# Patient Record
Sex: Female | Born: 1950 | Race: White | Marital: Married | Smoking: Never smoker
Health system: Northeastern US, Academic
[De-identification: ages and names within clinical notes are randomized; demographics above are authoritative.]

## PROBLEM LIST (undated history)

## (undated) DIAGNOSIS — M2550 Pain in unspecified joint: Secondary | ICD-10-CM

## (undated) DIAGNOSIS — J45909 Unspecified asthma, uncomplicated: Secondary | ICD-10-CM

## (undated) DIAGNOSIS — M1990 Unspecified osteoarthritis, unspecified site: Secondary | ICD-10-CM

## (undated) HISTORY — DX: Pain in unspecified joint: M25.50

## (undated) HISTORY — DX: Unspecified osteoarthritis, unspecified site: M19.90

---

## 2015-09-05 ENCOUNTER — Other Ambulatory Visit: Payer: Self-pay | Admitting: Pediatrics

## 2015-09-05 LAB — COMPREHENSIVE METABOLIC PANEL
ALT: 16 U/L (ref 0–35)
AST: 19 U/L (ref 0–35)
Albumin: 4.4 mg/dL (ref 3.5–5.2)
Alk Phos: 62 U/L (ref 35–105)
Anion Gap: 10 (ref 7–16)
Bilirubin,Total: 0.4 mg/dL (ref 0.0–1.2)
CO2: 28 mmol/L (ref 20–28)
Calcium: 9.1 mg/dL (ref 8.6–10.2)
Chloride: 102 mmol/L (ref 96–108)
Creatinine: 0.76 mg/dL (ref 0.51–0.95)
GFR,Black: 93 mL/min/{1.73_m2}
GFR,Caucasian: 77 mL/min/{1.73_m2}
Glucose: 89 mg/dL (ref 60–99)
Lab: 13 mg/dL (ref 6–20)
Potassium: 4.6 mmol/L (ref 3.4–4.7)
Sodium: 140 mmol/L (ref 133–145)
Total Protein: 6.9 g/dL (ref 6.3–7.7)

## 2015-09-05 LAB — CBC AND DIFFERENTIAL
Baso # K/uL: 0.1 10*3/uL (ref 0.0–0.1)
Basophil %: 1.2 % (ref 0.1–1.2)
Eos # K/uL: 0.4 10*3/uL (ref 0.0–0.4)
Eosinophil %: 7.4 % — ABNORMAL HIGH (ref 0.7–5.8)
Hematocrit: 40.2 % (ref 34.1–44.9)
Hemoglobin: 13.9 g/dL (ref 11.2–15.7)
Immature Granulocytes Absolute: 0.01 10*3/uL (ref 0.0–0.2)
Immature Granulocytes: 0.2 % (ref 0.0–2.0)
Lymph # K/uL: 1.7 10*3/uL (ref 1.2–3.7)
Lymphocyte %: 34.1 % (ref 19.3–51.7)
MCH: 30.8 pg (ref 25.6–32.2)
MCHC: 34.6 g/dL (ref 32.2–35.5)
MCV: 88.9 fL (ref 79.4–94.8)
Mono # K/uL: 0.3 10*3/uL (ref 0.2–0.9)
Monocyte %: 6.4 % (ref 4.7–12.5)
Neut # K/uL: 2.5 10*3/uL (ref 1.6–6.0)
Nucl RBC # K/uL: 0 /100 WBC (ref 0.0–0.2)
Platelets: 295 10*3/uL (ref 182–369)
RBC Distribution Width-SD: 40.8 fL (ref 36.4–46.3)
RBC: 4.52 10*6/uL (ref 3.93–5.22)
RDW: 12.6 % (ref 11.7–14.4)
Seg Neut %: 50.7 % (ref 34.0–71.1)
WBC: 5 10*3/uL (ref 4.0–10.0)

## 2015-09-05 LAB — TSH: TSH: 1.41 u[IU]/mL (ref 0.27–4.20)

## 2015-09-05 LAB — LIPID PANEL
Chol/HDL Ratio: 2.2 (ref 1.0–3.5)
Cholesterol: 211 mg/dL — ABNORMAL HIGH (ref 0–199)
HDL: 95 mg/dL — ABNORMAL HIGH (ref 40–60)
LDL Calculated: 102 mg/dL (ref 0–129)
Triglycerides: 71 mg/dL (ref 0–149)

## 2017-01-06 ENCOUNTER — Other Ambulatory Visit: Payer: Self-pay | Admitting: Pediatrics

## 2017-01-06 LAB — COMPREHENSIVE METABOLIC PANEL
ALT: 21 U/L (ref 0–35)
AST: 22 U/L (ref 0–35)
Albumin: 4.6 g/dL (ref 3.5–5.2)
Alk Phos: 64 U/L (ref 35–105)
Anion Gap: 9 (ref 7–16)
Bilirubin,Total: 0.4 mg/dL (ref 0.0–1.2)
CO2: 28 mmol/L (ref 20–28)
Calcium: 9.6 mg/dL (ref 8.6–10.2)
Chloride: 103 mmol/L (ref 96–108)
Creatinine: 0.79 mg/dL (ref 0.51–0.95)
GFR,Black: 88 mL/min/{1.73_m2}
GFR,Caucasian: 73 mL/min/{1.73_m2}
Glucose: 101 mg/dL — ABNORMAL HIGH (ref 60–99)
Lab: 9 mg/dL (ref 6–20)
Potassium: 4.7 mmol/L (ref 3.4–4.7)
Sodium: 140 mmol/L (ref 133–145)
Total Protein: 7.1 g/dL (ref 6.3–7.7)

## 2017-01-06 LAB — CBC AND DIFFERENTIAL
Baso # K/uL: 0.1 10*3/uL (ref 0.0–0.1)
Basophil %: 1.2 % (ref 0.1–1.2)
Eos # K/uL: 0.3 10*3/uL (ref 0.0–0.4)
Eosinophil %: 5.1 % (ref 0.7–5.8)
Hematocrit: 40.4 % (ref 34.1–44.9)
Hemoglobin: 13.8 g/dL (ref 11.2–15.7)
Immature Granulocytes Absolute: 0.01 10*3/uL (ref 0.0–0.2)
Immature Granulocytes: 0.2 % (ref 0.0–2.0)
Lymph # K/uL: 1.6 10*3/uL (ref 1.2–3.7)
Lymphocyte %: 32.4 % (ref 19.3–51.7)
MCH: 31.1 pg (ref 25.6–32.2)
MCHC: 34.2 g/dL (ref 32.2–35.5)
MCV: 91 fL (ref 79.4–94.8)
Mono # K/uL: 0.4 10*3/uL (ref 0.2–0.9)
Monocyte %: 7.5 % (ref 4.7–12.5)
Neut # K/uL: 2.7 10*3/uL (ref 1.6–6.0)
Nucl RBC # K/uL: 0 /100 WBC (ref 0.0–0.2)
Platelets: 317 10*3/uL (ref 182–369)
RBC Distribution Width-SD: 41.3 fL (ref 36.4–46.3)
RBC: 4.44 10*6/uL (ref 3.93–5.22)
RDW: 12.5 % (ref 11.7–14.4)
Seg Neut %: 53.6 % (ref 34.0–71.1)
WBC: 4.9 10*3/uL (ref 4.0–10.0)

## 2017-01-06 LAB — LIPID PANEL
Chol/HDL Ratio: 2.2 (ref 1.0–3.5)
Cholesterol: 218 mg/dL — ABNORMAL HIGH (ref 0–199)
HDL: 99 mg/dL — ABNORMAL HIGH (ref 40–60)
LDL Calculated: 107 mg/dL (ref 0–129)
Triglycerides: 58 mg/dL (ref 0–149)

## 2017-01-06 LAB — TSH: TSH: 1.89 u[IU]/mL (ref 0.27–4.20)

## 2017-12-15 ENCOUNTER — Ambulatory Visit
Admission: AD | Admit: 2017-12-15 | Discharge: 2017-12-15 | Disposition: A | Discharge status: Home / Self Care | Payer: Medicare (Managed Care) | Attending: Emergency Medicine | Admitting: Emergency Medicine

## 2017-12-15 DIAGNOSIS — B029 Zoster without complications: Secondary | ICD-10-CM | POA: Insufficient documentation

## 2017-12-15 MED ORDER — PREDNISONE 20 MG PO TABS *I*
40.0000 mg | ORAL_TABLET | Freq: Every day | ORAL | 0 refills | Status: AC
Start: 2017-12-15 — End: 2017-12-19

## 2017-12-15 MED ORDER — ACYCLOVIR 800 MG PO TABS *I*
800.0000 mg | ORAL_TABLET | Freq: Every day | ORAL | 0 refills | Status: AC
Start: 2017-12-15 — End: 2017-12-22

## 2017-12-15 NOTE — Discharge Instructions (Signed)
Keep site covered avoid individuals who are pregnant or immune compromise. You may take aleve for discomfort.

## 2017-12-15 NOTE — UC Provider Note (Signed)
History     Chief Complaint   Patient presents with    Herpes Zoster     started wuith burning and pain on back daughter looked at back and thinks it may be shingles. had on back for 4 days     HPI   Patient reports four days ago started with a burning sensation on back that wrapped around under bra. Today asked daughter to rub ointment on to assist with pain, daughter reported to her she believes she has shingles. Patient reports she has a history chicken pox. Denies receiving shingles vaccine, prior history of shingles, known exposure.     Medical/Surgical/Family History     History reviewed. No pertinent past medical history.     There is no problem list on file for this patient.           History reviewed. No pertinent surgical history.  History reviewed. No pertinent family history.       Social History   Substance Use Topics    Smoking status: Never Smoker    Smokeless tobacco: Not on file    Alcohol use No     Living Situation     Questions Responses    Patient lives with     Homeless     Caregiver for other family member     External Services     Employment     Domestic Violence Risk                 Review of Systems   Review of Systems   Constitutional: Negative for fever.   Skin: Positive for rash.       Physical Exam   Triage Vitals  Triage Start: Start, (12/15/17 1518)   First Recorded BP: 106/68, Resp: 18, Temp: 37.1 C (98.7 F), Temp src: TEMPORAL Oxygen Therapy SpO2: 99 %, Oximetry Source: Rt Hand, O2 Device: None (Room air), Heart Rate: 87, (12/15/17 1522)  .  First Pain Reported  0-10 Scale: 3, Pain Location/Orientation: Back, (12/15/17 1522)       Physical Exam   Constitutional: She is oriented to person, place, and time. No distress.   Neurological: She is alert and oriented to person, place, and time.   Skin: Rash noted. Rash is vesicular.        Nursing note and vitals reviewed.       Medical Decision Making        Initial Evaluation:  ED First Provider Contact     Date/Time Event User  Comments    12/15/17 1516 ED First Provider Contact HUITT, Tajai Suder S Initial Face to Face Provider Contact          Patient was seen on: 12/15/2017        Assessment:  67 y.o.female comes to the Urgent Care Center with rash.    Differential Diagnosis includes:  Shingles  Contact Dermatitis   Allergic reaction     Plan:   Keep site covered avoid individuals who are pregnant or immune compromise. You may take aleve for discomfort.         Discharge Medication List as of 12/15/2017  3:41 PM      START taking these medications    Details   acyclovir (ZOVIRAX) 800 MG tablet Take 1 tablet (800 mg total) by mouth 5 times daily for 7 daysDisp-35 tablet, R-0, NormalEmergency Encounter      predniSONE (DELTASONE) 20 MG tablet Take 2 tablets (40 mg total) by mouth dailyDisp-8 tablet, R-0,  Normal             Final Diagnosis  Final diagnoses:   [B02.9] Herpes zoster without complication (Primary)         Rickard Patience, NP       I have reviewed nursing documentation, confirmed information with patient, and revised with nursing as necessary.       Huitt, Julieanne Manson, NP  12/15/17 1552

## 2018-01-25 ENCOUNTER — Ambulatory Visit
Admission: AD | Admit: 2018-01-25 | Discharge: 2018-01-25 | Payer: Medicare (Managed Care) | Source: Ambulatory Visit | Attending: Urgent Care | Admitting: Urgent Care

## 2018-01-25 DIAGNOSIS — L03113 Cellulitis of right upper limb: Secondary | ICD-10-CM

## 2018-01-25 HISTORY — DX: Unspecified asthma, uncomplicated: J45.909

## 2018-01-25 LAB — UNMAPPED LAB RESULTS
Basophil # (HT): 0 10 3/uL (ref 0.0–0.1)
Basophil % (HT): 1 % (ref 0–3)
Eosinophil # (HT): 0.2 10 3/uL (ref 0.0–0.8)
Eosinophil % (HT): 2 % (ref 0–5)
Hematocrit (HT): 40 % (ref 38–48)
Hemoglobin (HGB) (HT): 13.6 g/dL (ref 11.9–15.9)
Lymphocyte # (HT): 1.1 10 3/uL (ref 0.6–3.3)
Lymphocyte % (HT): 13 % — ABNORMAL LOW (ref 15–45)
MCHC (HT): 33.9 g/dL (ref 31.1–34.5)
MCV (HT): 90 fL (ref 80–97)
Mean Corpuscular Hemoglobin (MCH) (HT): 30.6 pg (ref 26.6–30.6)
Mean Platelet Volume (HT): 9.3 fL (ref 9.0–12.2)
Monocyte # (HT): 0.7 10 3/uL (ref 0.1–1.1)
Monocyte % (HT): 8 % (ref 0–15)
Neutrophil # (HT): 6.4 10 3/uL (ref 2.0–7.1)
Platelets (HT): 249 10 3/uL (ref 142–414)
RBC (HT): 4.44 10 6/uL (ref 4.04–5.48)
RDW (HT): 13.3 % (ref 12.9–16.3)
Seg Neut % (HT): 76 % — ABNORMAL HIGH (ref 45–75)
WBC (HT): 8.4 10 3/uL (ref 4.8–10.4)

## 2018-01-25 NOTE — ED Triage Notes (Signed)
Had sliver in right hand thumb. Son in law attempted to remove sliver, thumb became more swollen after sliver removed.        Triage Note   Leah Sarineborah Yaden Seith, LPN

## 2018-01-25 NOTE — UC Provider Note (Signed)
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  She noticed possibly obtaining a splinter from her firewood several days ago.  Family member remove splint her and since then finger has turned increasingly red and swollen and painful.  Patient didn't want to come in but daughter encouraged her to do so.  Patient denies any fevers.  She notes pain with movement.  She has no diabetes nor underlying compromised immune function          Medical/Surgical/Family History     Past Medical History:   Diagnosis Date    Asthma         There is no problem list on file for this patient.           History reviewed. No pertinent surgical history.  No family history on file.       Social History     Tobacco Use    Smoking status: Never Smoker   Substance Use Topics    Alcohol use: Yes    Drug use: No     Living Situation     Questions Responses    Patient lives with     Homeless     Caregiver for other family member     External Services     Employment     Domestic Violence Risk                 Review of Systems   Review of Systems   All other systems reviewed and are negative.      Physical Exam   Triage Vitals      First Recorded BP: 120/80, Resp: 18, Temp: 36.8 C (98.3 F), Temp src: TEMPORAL Oxygen Therapy SpO2: 98 %, Oximetry Source: Rt Hand, O2 Device: None (Room air), Heart Rate: 87, (01/25/18 1245) Heart Rate (via Pulse Ox): 87, (01/25/18 1245).      Physical Exam  Vitals signs and nursing note reviewed.   Constitutional:       Comments: Very pleasant white female appears slightly anxious   Skin:     Comments: Moderate edema and erythema of the entire distal thumb.  Healing fissure noted on the palmar distal thumb stop his wrist tenderness to palpation.  There is no flocculence.  Streaking  erythematous line to distal forearm radially.  No epicondylar or axillary adenopathy present.  No evidence of foreign body   Neurological:      Mental Status: She is alert.          Medical Decision Making        Initial Evaluation:  ED First Provider Contact     Date/Time Event User Comments    01/25/18 1245 ED First Provider Contact Birdie Riddle, Jayni Prescher Initial Face to Face Provider Contact          Patient was seen on: 01/25/2018        Assessment:  67 y.o.female comes to the Urgent Care Center with worsening cellulitis right thumb      Plan: Findings discussed with patient.  Due to pain and extensive swelling now with streaking will refer to emergency room for further evaluation and treatment.  Patient asks if I could just order oral antibiotic .  not deemed in her best interest  as she doesn't appear to be reliable and follow-up.  Unsure if finger is at the point where it needs to be opened and drained.  Given that patient needs her home for her livelihood she indicates understanding and agreement to above.  Daughter will try.  There is cause report for transfer facility         Final Diagnosis  Final diagnoses:   [L03.113] Cellulitis of right upper extremity (Primary)         Virginia RochesterSUSAN Secundino Ellithorpe, MD       I have reviewed nursing documentation, confirmed information with patient, and revised with nursing as necessary.       Virginia RochesterSharza, Mylo Driskill, MD  01/25/18 [phone]

## 2018-01-25 NOTE — Discharge Instructions (Signed)
Go directly to emergency room.  Nothing to eat or drink

## 2018-02-17 LAB — UNMAPPED LAB RESULTS
Hematocrit (HT): 39 % (ref 38–48)
Hemoglobin (HGB) (HT): 13.1 g/dL (ref 11.9–15.9)
MCHC (HT): 33.6 g/dL (ref 31.1–34.5)
MCV (HT): 91 fL (ref 80–97)
Mean Corpuscular Hemoglobin (MCH) (HT): 30.5 pg (ref 26.6–30.6)
Mean Platelet Volume (HT): 10.1 fL (ref 9.0–12.2)
Platelets (HT): 361 10 3/uL (ref 142–414)
RBC (HT): 4.29 10 6/uL (ref 4.04–5.48)
RDW (HT): 13 % (ref 12.9–16.3)
WBC (HT): 6.2 10 3/uL (ref 4.8–10.4)

## 2020-01-11 ENCOUNTER — Other Ambulatory Visit
Admission: RE | Admit: 2020-01-11 | Discharge: 2020-01-11 | Disposition: A | Discharge status: Home / Self Care | Payer: Medicare (Managed Care) | Source: Ambulatory Visit | Attending: Family Medicine | Admitting: Family Medicine

## 2020-01-11 DIAGNOSIS — J4520 Mild intermittent asthma, uncomplicated: Secondary | ICD-10-CM | POA: Insufficient documentation

## 2020-01-11 DIAGNOSIS — J309 Allergic rhinitis, unspecified: Secondary | ICD-10-CM | POA: Insufficient documentation

## 2020-01-11 DIAGNOSIS — E782 Mixed hyperlipidemia: Secondary | ICD-10-CM | POA: Insufficient documentation

## 2020-01-11 LAB — COMPREHENSIVE METABOLIC PANEL
ALT: 23 U/L (ref 0–35)
AST: 21 U/L (ref 0–35)
Albumin: 4.9 g/dL (ref 3.5–5.2)
Alk Phos: 65 U/L (ref 35–105)
Anion Gap: 12 (ref 7–16)
Bilirubin,Total: 0.3 mg/dL (ref 0.0–1.2)
CO2: 27 mmol/L (ref 20–28)
Calcium: 9.7 mg/dL (ref 8.6–10.2)
Chloride: 99 mmol/L (ref 96–108)
Creatinine: 0.66 mg/dL (ref 0.51–0.95)
GFR,Black: 105 *
GFR,Caucasian: 91 *
Glucose: 105 mg/dL — ABNORMAL HIGH (ref 60–99)
Lab: 11 mg/dL (ref 6–20)
Potassium: 4.4 mmol/L (ref 3.3–5.1)
Sodium: 138 mmol/L (ref 133–145)
Total Protein: 6.7 g/dL (ref 6.3–7.7)

## 2020-01-11 LAB — CBC
Hematocrit: 40 % (ref 34–45)
Hemoglobin: 13.4 g/dL (ref 11.2–15.7)
MCH: 31 pg (ref 26–32)
MCHC: 33 g/dL (ref 32–36)
MCV: 94 fL (ref 79–95)
Platelets: 355 10*3/uL (ref 160–370)
RBC: 4.3 MIL/uL (ref 3.9–5.2)
RDW: 12.6 % (ref 11.7–14.4)
WBC: 5.6 10*3/uL (ref 4.0–10.0)

## 2020-01-11 LAB — LIPID PANEL
Chol/HDL Ratio: 2.3
Cholesterol: 232 mg/dL — AB
HDL: 99 mg/dL — ABNORMAL HIGH (ref 40–60)
LDL Calculated: 116 mg/dL
Non HDL Cholesterol: 133 mg/dL
Triglycerides: 86 mg/dL

## 2020-12-19 ENCOUNTER — Other Ambulatory Visit
Admission: RE | Admit: 2020-12-19 | Discharge: 2020-12-19 | Disposition: A | Discharge status: Home / Self Care | Payer: Medicare (Managed Care) | Source: Ambulatory Visit | Attending: Family Medicine | Admitting: Family Medicine

## 2020-12-19 DIAGNOSIS — E782 Mixed hyperlipidemia: Secondary | ICD-10-CM | POA: Insufficient documentation

## 2020-12-19 LAB — COMPREHENSIVE METABOLIC PANEL
ALT: 23 U/L (ref 0–35)
AST: 19 U/L (ref 0–35)
Albumin: 4.5 g/dL (ref 3.5–5.2)
Alk Phos: 66 U/L (ref 35–105)
Anion Gap: 12 (ref 7–16)
Bilirubin,Total: 0.2 mg/dL (ref 0.0–1.2)
CO2: 27 mmol/L (ref 20–28)
Calcium: 10.4 mg/dL — ABNORMAL HIGH (ref 8.6–10.2)
Chloride: 97 mmol/L (ref 96–108)
Creatinine: 0.74 mg/dL (ref 0.51–0.95)
Glucose: 91 mg/dL (ref 60–99)
Lab: 13 mg/dL (ref 6–20)
Potassium: 4.8 mmol/L (ref 3.3–5.1)
Sodium: 136 mmol/L (ref 133–145)
Total Protein: 6.9 g/dL (ref 6.3–7.7)
eGFR BY CREAT: 88 *

## 2020-12-19 LAB — LIPID PANEL
Chol/HDL Ratio: 2.7
Cholesterol: 255 mg/dL — AB
HDL: 95 mg/dL — ABNORMAL HIGH (ref 40–60)
LDL Calculated: 136 mg/dL — AB
Non HDL Cholesterol: 160 mg/dL
Triglycerides: 119 mg/dL

## 2020-12-19 LAB — CBC
Hematocrit: 41 % (ref 34–45)
Hemoglobin: 13.6 g/dL (ref 11.2–15.7)
MCH: 31 pg (ref 26–32)
MCHC: 33 g/dL (ref 32–36)
MCV: 93 fL (ref 79–95)
Platelets: 325 10*3/uL (ref 160–370)
RBC: 4.4 MIL/uL (ref 3.9–5.2)
RDW: 12.5 % (ref 11.7–14.4)
WBC: 5.7 10*3/uL (ref 4.0–10.0)

## 2020-12-19 LAB — TSH: TSH: 1.1 u[IU]/mL (ref 0.27–4.20)

## 2021-12-20 ENCOUNTER — Other Ambulatory Visit
Admission: RE | Admit: 2021-12-20 | Discharge: 2021-12-20 | Disposition: A | Discharge status: Home / Self Care | Payer: Medicare (Managed Care) | Source: Ambulatory Visit | Attending: Family Medicine | Admitting: Family Medicine

## 2021-12-20 DIAGNOSIS — Z0000 Encounter for general adult medical examination without abnormal findings: Secondary | ICD-10-CM | POA: Insufficient documentation

## 2021-12-20 LAB — LIPID PANEL
Chol/HDL Ratio: 2.2
Cholesterol: 247 mg/dL — AB
HDL: 110 mg/dL — ABNORMAL HIGH (ref 40–60)
LDL Calculated: 116 mg/dL
Non HDL Cholesterol: 137 mg/dL
Triglycerides: 103 mg/dL

## 2021-12-20 LAB — COMPREHENSIVE METABOLIC PANEL
ALT: 20 U/L (ref 0–35)
AST: 17 U/L (ref 0–35)
Albumin: 4.6 g/dL (ref 3.5–5.2)
Alk Phos: 68 U/L (ref 35–105)
Anion Gap: 11 (ref 7–16)
Bilirubin,Total: 0.2 mg/dL (ref 0.0–1.2)
CO2: 28 mmol/L (ref 20–28)
Calcium: 10.4 mg/dL — ABNORMAL HIGH (ref 8.6–10.2)
Chloride: 98 mmol/L (ref 96–108)
Creatinine: 0.68 mg/dL (ref 0.51–0.95)
Glucose: 101 mg/dL — ABNORMAL HIGH (ref 60–99)
Lab: 15 mg/dL (ref 6–20)
Potassium: 5.2 mmol/L — ABNORMAL HIGH (ref 3.3–5.1)
Sodium: 137 mmol/L (ref 133–145)
Total Protein: 6.8 g/dL (ref 6.3–7.7)
eGFR BY CREAT: 94 *

## 2021-12-20 LAB — CBC
Hematocrit: 39 % (ref 34–49)
Hemoglobin: 13.2 g/dL (ref 11.2–16.0)
MCH: 31 pg (ref 26–32)
MCHC: 34 g/dL (ref 32–36)
MCV: 94 fL (ref 75–100)
Platelets: 381 10*3/uL (ref 150–450)
RBC: 4.2 MIL/uL (ref 4.0–5.5)
RDW: 12.7 % (ref 0.0–15.0)
WBC: 6.2 10*3/uL (ref 3.5–11.0)

## 2022-02-19 LAB — UNMAPPED LAB RESULTS
Hematocrit (HT): 39 % (ref 38–48)
Hemoglobin (HGB) (HT): 13.9 g/dL (ref 11.9–15.9)
MCHC (HT): 35.3 g/dL — ABNORMAL HIGH (ref 31.1–34.5)
MCV (HT): 89 fL (ref 80–97)
Mean Corpuscular Hemoglobin (MCH) (HT): 31.4 pg — ABNORMAL HIGH (ref 26.6–30.6)
Mean Platelet Volume (HT): 9.3 fL (ref 9.0–12.2)
Platelets (HT): 439 10 3/uL (ref 150–450)
RBC (HT): 4.42 10 6/uL (ref 4.04–5.48)
RDW (HT): 12.1 % — ABNORMAL LOW (ref 12.9–16.3)
WBC (HT): 5.7 10 3/uL (ref 4.8–10.4)

## 2022-03-06 LAB — UNMAPPED LAB RESULTS
ABO RH Blood Type (HT): O NEG
Antibody Screen (HT): NEGATIVE

## 2022-08-06 ENCOUNTER — Other Ambulatory Visit
Admission: RE | Admit: 2022-08-06 | Discharge: 2022-08-06 | Disposition: A | Discharge status: Home / Self Care | Payer: Medicare (Managed Care) | Source: Ambulatory Visit | Attending: Family Medicine | Admitting: Family Medicine

## 2022-08-06 DIAGNOSIS — E782 Mixed hyperlipidemia: Secondary | ICD-10-CM | POA: Insufficient documentation

## 2022-08-06 LAB — COMPREHENSIVE METABOLIC PANEL
ALT: 21 U/L (ref 0–35)
AST: 19 U/L (ref 0–35)
Albumin: 4.8 g/dL (ref 3.5–5.2)
Alk Phos: 62 U/L (ref 35–105)
Anion Gap: 12 (ref 7–16)
Bilirubin,Total: 0.3 mg/dL (ref 0.0–1.2)
CO2: 25 mmol/L (ref 20–28)
Calcium: 9.7 mg/dL (ref 8.6–10.2)
Chloride: 102 mmol/L (ref 96–108)
Creatinine: 0.65 mg/dL (ref 0.51–0.95)
Glucose: 92 mg/dL (ref 60–99)
Lab: 9 mg/dL (ref 6–20)
Potassium: 4.8 mmol/L (ref 3.3–5.1)
Sodium: 139 mmol/L (ref 133–145)
Total Protein: 6.6 g/dL (ref 6.3–7.7)
eGFR BY CREAT: 94 *

## 2022-08-06 LAB — CBC
Hematocrit: 40 % (ref 34–49)
Hemoglobin: 13.2 g/dL (ref 11.2–16.0)
MCV: 92 fL (ref 75–100)
Platelets: 325 10*3/uL (ref 150–450)
RBC: 4.4 MIL/uL (ref 4.0–5.5)
RDW: 14 % (ref 0.0–15.0)
WBC: 4.9 10*3/uL (ref 3.5–11.0)

## 2022-08-06 LAB — LIPID PANEL
Chol/HDL Ratio: 2.3
Cholesterol: 231 mg/dL — AB
HDL: 99 mg/dL — ABNORMAL HIGH (ref 40–60)
LDL Calculated: 114 mg/dL
Non HDL Cholesterol: 132 mg/dL
Triglycerides: 90 mg/dL

## 2023-08-07 ENCOUNTER — Ambulatory Visit: Payer: Medicare (Managed Care) | Attending: Family Medicine | Admitting: Family Medicine

## 2023-08-07 ENCOUNTER — Encounter: Payer: Self-pay | Admitting: Family Medicine

## 2023-08-07 ENCOUNTER — Other Ambulatory Visit: Payer: Self-pay

## 2023-08-07 VITALS — BP 118/58 | HR 83 | Temp 97.3°F | Resp 18 | Ht 65.5 in | Wt 131.0 lb

## 2023-08-07 DIAGNOSIS — E782 Mixed hyperlipidemia: Secondary | ICD-10-CM | POA: Insufficient documentation

## 2023-08-07 DIAGNOSIS — J4520 Mild intermittent asthma, uncomplicated: Secondary | ICD-10-CM | POA: Insufficient documentation

## 2023-08-07 LAB — LIPID PANEL
Chol/HDL Ratio: 2.1
Cholesterol: 247 mg/dL — AB
HDL: 115 mg/dL — ABNORMAL HIGH (ref 40–60)
LDL Calculated: 117 mg/dL
Non HDL Cholesterol: 132 mg/dL
Triglycerides: 89 mg/dL

## 2023-08-07 LAB — COMPREHENSIVE METABOLIC PANEL
ALT: 27 U/L (ref 0–35)
AST: 22 U/L (ref 0–35)
Albumin: 4.7 g/dL (ref 3.5–5.2)
Alk Phos: 68 U/L (ref 35–105)
Anion Gap: 15 (ref 7–16)
Bilirubin,Total: 0.3 mg/dL (ref 0.0–1.2)
CO2: 23 mmol/L (ref 20–28)
Calcium: 9.5 mg/dL (ref 8.6–10.2)
Chloride: 99 mmol/L (ref 96–108)
Creatinine: 0.68 mg/dL (ref 0.51–0.95)
Glucose: 97 mg/dL (ref 60–99)
Lab: 12 mg/dL (ref 6–20)
Potassium: 4.9 mmol/L (ref 3.3–5.1)
Sodium: 137 mmol/L (ref 133–145)
Total Protein: 6.8 g/dL (ref 6.3–7.7)
eGFR BY CREAT: 93 *

## 2023-08-07 LAB — CBC
Hematocrit: 41 % (ref 34–49)
Hemoglobin: 14.1 g/dL (ref 11.2–16.0)
MCV: 90 fL (ref 75–100)
Platelets: 360 10*3/uL (ref 150–450)
RBC: 4.6 MIL/uL (ref 4.0–5.5)
RDW: 12.7 % (ref 0.0–15.0)
WBC: 5.5 10*3/uL (ref 3.5–11.0)

## 2023-08-07 MED ORDER — ALBUTEROL SULFATE HFA 108 (90 BASE) MCG/ACT IN AERS *I*
1.0000 | INHALATION_SPRAY | Freq: Four times a day (QID) | RESPIRATORY_TRACT | 4 refills | Status: DC | PRN
Start: 2023-08-07 — End: 2023-09-03

## 2023-08-07 MED ORDER — FLUTICASONE-SALMETEROL 100-50 MCG/ACT IN AEPB *I*
1.0000 | INHALATION_SPRAY | Freq: Two times a day (BID) | RESPIRATORY_TRACT | 5 refills | Status: AC
Start: 2023-08-07 — End: 2023-09-06

## 2023-08-07 NOTE — Progress Notes (Signed)
 [facility]     Subjective     Lucine is a 73 y.o. female who presents for Follow-up  History of Present Illness  The patient is a 73 year old female who presents for an annual visit.    She reports an increased need for her asthma medications this year compared to previous years, which she attributes to dampness or other environmental factors. She has been using Advair 100 mcg/50 mcg once daily, which she finds effective. Additionally, she uses ProAir  as needed. She does not experience any headaches, blurred vision, double vision, difficulty breathing, cough, wheezing, or headaches.    She has a history of high cholesterol and has made dietary modifications, including reducing her intake of high-fat cookies. She maintains an active lifestyle with regular exercise.    She expresses interest in obtaining a prescription for ivermectin, citing concerns about potential parasite exposure [place] and close contact with her puppy.    She has not undergone a bone density test and does not wish to do so. She consumes approximately a gallon of milk per week.    She declines mammogram and colon cancer screening. She received a pneumonia vaccine several years ago but does not want another one. She has had shingles and is uncertain about her vaccination status. She did not receive the COVID-19 vaccine and contracted the virus but recovered. She does not receive influenza vaccines. She does not perform self-breast exams.    PAST SURGICAL HISTORY:  She underwent hip surgery performed by Dr. Earline Glenn in [place] and reports no current issues.    SOCIAL HISTORY  She does not smoke.    FAMILY HISTORY  Her grandmother had a heart attack at 40 and passed away. Her father was diagnosed with heart disease around the patient's age and had quadruple bypass surgery.       Objective   Blood pressure 118/58, pulse 83, temperature 36.3 C (97.3 F), resp. rate 18, height 1.664 m (5' 5.5"),  weight 59.4 kg (131 lb), SpO2 98%.  Physical Exam  General: No acute distress, alert and oriented x3  Neck: Supple, no lymphadenopathy, no thyroid  enlargement or masses  Respiratory: Clear to auscultation bilaterally  Cardiovascular: Regular rate and rhythm, no murmurs, rubs, or gallops  Breast: Normal appearance, no masses or tenderness, no nipple retraction, dimpling, or discharge    Results  Labs        The 10-year ASCVD risk score (Arnett DK, et al., 2019) is: 8.6%    Values used to calculate the score:      Age: 73 years      Sex: Female      Is Non-Hispanic African American: No      Diabetic: No      Tobacco smoker: No      Systolic Blood Pressure: 118 mmHg      Is BP treated: No      HDL Cholesterol: 99 mg/dL      Total Cholesterol: 231 mg/dL       Assessment & Plan  1. Mild intermittent asthma.  - Asthma has been exacerbated this spring due to weather changes.  - No acute distress noted during physical examination; lungs clear to auscultation bilaterally.  - Discussed the need for refilling Advair 100 mcg/50 mcg and ProAir  prescriptions.  - Prescriptions for Advair 100 mcg/50 mcg and ProAir  will be refilled.    2. Hypercholesterolemia.  - Cholesterol levels have been mildly elevated in the past  -  Based on last year's data, the 10-year risk of developing heart disease is estimated at 8.5%.  - Blood work will be ordered today to monitor cholesterol levels.      3. Health Maintenance.  - Declined mammogram and colon cancer screening.  - Declined pneumonia and shingles vaccines, as well as COVID-19 and influenza vaccines.  - Breast exam performed during the visit; no abnormalities found.  - Encouraged to continue self-breast exams and maintain current health practices.                 Author: Arsenio Larger, MD  Note signed: 08/07/2023

## 2023-08-11 ENCOUNTER — Telehealth: Payer: Self-pay | Admitting: Family Medicine

## 2023-08-11 NOTE — Telephone Encounter (Signed)
 Patient needs a refill on her fluticasone 

## 2023-08-12 NOTE — Telephone Encounter (Signed)
 PA submitted to CoverMyMeds  Key Code: [id number]  Will hold for decision on PA.

## 2023-08-13 ENCOUNTER — Telehealth: Payer: Self-pay | Admitting: Family Medicine

## 2023-08-13 NOTE — Telephone Encounter (Signed)
 Request for medicare prescription prior auth paper work came through the faxes this morning. Printed and in nurses office for completion

## 2023-08-13 NOTE — Telephone Encounter (Signed)
 This is a continuation of care.   Form completed, to provider to review, sign and have reception fax in.

## 2023-08-13 NOTE — Telephone Encounter (Signed)
 Prior authorization approved thru Cover my meds.

## 2023-08-14 ENCOUNTER — Telehealth: Payer: Self-pay | Admitting: Family Medicine

## 2023-08-14 NOTE — Telephone Encounter (Signed)
 No phone number or rep to call back on this. If rep calls back, please get phone number where to call back

## 2023-08-14 NOTE — Telephone Encounter (Signed)
 [employer] rep on hold for nurse in regards to medications

## 2023-09-03 ENCOUNTER — Other Ambulatory Visit: Payer: Self-pay | Admitting: Family Medicine

## 2023-09-03 NOTE — Telephone Encounter (Signed)
 Last seen 08/07/23 next appt set for 08/06/24  Labs done 5/25  Proair  Respliclick in [place] but not epic.  Call to pharmacy to confirm what she has been dispensed. Confirmed with pharmacy she is using the respiclick form.  Updated this med in med list, and q'd refill to provider.

## 2023-09-04 NOTE — Telephone Encounter (Signed)
 Provider declined script but did new one with more refills and submitted. Nothing further needed.

## 2023-09-09 ENCOUNTER — Telehealth: Payer: Self-pay | Admitting: Family Medicine

## 2023-09-09 NOTE — Telephone Encounter (Signed)
 Received a approval for Albuterol  Inhaler...    Outcome  Approved on June 26 by [facility] [employer] 2017  CaseId:[id number];Status:Approved;Review Type:Prior Auth;Coverage Start Date:08/05/2023;Coverage End Date:09/03/2024;  Effective Date: 08/05/2023  Authorization Expiration Date: 09/03/2024

## 2023-11-10 DEATH — deceased

## 2023-12-23 ENCOUNTER — Encounter: Payer: Self-pay | Admitting: Gastroenterology

## 2024-01-19 ENCOUNTER — Telehealth: Payer: Self-pay | Admitting: Family Medicine

## 2024-01-19 MED ORDER — CELECOXIB 200 MG PO CAPS *I*
200.0000 mg | ORAL_CAPSULE | Freq: Two times a day (BID) | ORAL | 5 refills | Status: AC | PRN
Start: 2024-01-19 — End: 2024-02-18

## 2024-01-19 NOTE — Telephone Encounter (Signed)
 Refill requestMedication name:CelebrexMedication dose:Medication name:Medication dose:Medication name:Medication dose:Current Medications[1]Confirm that the patient wants the medication sent to: [facility] [address] [place] - [address] [place] [address]Eynwz: [phone] Fax: [phone]  Additional message: Last Office Visit   Date Provider Department Visit Type Primary Dx  08/07/2023 Hollice Cassondra GAILS, MD [facility] Office Visit Mild intermittent asthma  Last Telemedicine Visit  None Last PA Office Visit  None Next visit: Future Encounters    08/06/2024  9:15 AM Sch  FOLLOW UP VISIT  Authorization not required  [facility]  Lammers, Jonathon V, MD        [1] Current Outpatient Medications:   PROAIR  RESPICLICK 108 (90 Base) MCG/ACT inhaler, INHALE 2 PUFFS BY MOUTH EVERY 6 HOURS AS NEEDED, Disp: 1 each, Rfl: 2  albuterol  (PROAIR  RESPICLICK) 108 (90 Base) MCG/ACT inhaler, Inhale 2 puffs into the lungs every 6 hours as needed for Wheezing., Disp: , Rfl:   Ferrous Sulfate (IRON) 325 (65 Fe) MG TABS, Take 1 tablet (325 mg total) by mouth daily., Disp: , Rfl:   cholecalciferol, Vitamin D3, (CHOLECALCIFEROL) 50 mcg (2,000 unit) tablet, Take 1 tablet (2,000 units total) by mouth daily., Disp: , Rfl:   cyanocobalamin (VITAMIN B-12) 500 mcg tablet, Take 2 tablets (1,000 mcg total) by mouth daily., Disp: , Rfl:   Omega-3 Fatty Acids (FISH OIL) 1200 MG CPDR, Take by mouth., Disp: , Rfl:   Oregano 1500 MG CAPS, Take by mouth., Disp: , Rfl:   Specialty Vitamins Products (COLLAGEN ULTRA) CAPS, Take by mouth., Disp: , Rfl:   magnesium oxide (MAG-OX) 400 (241.3 mg) mg tablet, Take 1 tablet (400 mg total) by mouth daily., Disp: , Rfl:   multi-vitamin (THERAGRAN) tablet, Take 1 tablet by mouth daily., Disp: , Rfl:

## 2024-01-19 NOTE — Telephone Encounter (Signed)
 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

## 2024-01-19 NOTE — Addendum Note (Signed)
 Addended by: Errin Chewning on: 01/19/2024 03:36 PM Modules accepted: Orders

## 2024-01-19 NOTE — Telephone Encounter (Signed)
 Ordered.I would only use as needed if able as it can affect her kidney function and can still increase the risk of ulcer

## 2024-01-19 NOTE — Telephone Encounter (Signed)
 Call to cell number, advised patient per provider. Advised if takes celebrex advised to take as needed only and to take with glass of water and food. Advised if feeling needs frequently to call may recommend alternative if needing routinely. She agrees to do

## 2024-08-06 ENCOUNTER — Ambulatory Visit: Payer: Medicare (Managed Care) | Admitting: Family Medicine

## 8496-08-09 DEATH — deceased
